# Patient Record
Sex: Male | Born: 1965 | Race: Black or African American | Hispanic: No | Marital: Married | Smoking: Never smoker
Health system: Southern US, Community
[De-identification: ages and names within clinical notes are randomized; demographics above are authoritative.]

## PROBLEM LIST (undated history)

## (undated) DIAGNOSIS — I10 Essential (primary) hypertension: Secondary | ICD-10-CM

---

## 2005-07-06 ENCOUNTER — Emergency Department: Payer: Self-pay | Admitting: Unknown Physician Specialty

## 2019-07-03 ENCOUNTER — Emergency Department: Payer: No Typology Code available for payment source

## 2019-07-03 ENCOUNTER — Encounter: Payer: Self-pay | Admitting: Emergency Medicine

## 2019-07-03 ENCOUNTER — Other Ambulatory Visit: Payer: Self-pay

## 2019-07-03 ENCOUNTER — Emergency Department
Admission: EM | Admit: 2019-07-03 | Discharge: 2019-07-03 | Disposition: A | Discharge status: Home / Self Care | Payer: No Typology Code available for payment source | Attending: Emergency Medicine | Admitting: Emergency Medicine

## 2019-07-03 DIAGNOSIS — Y92410 Unspecified street and highway as the place of occurrence of the external cause: Secondary | ICD-10-CM | POA: Diagnosis not present

## 2019-07-03 DIAGNOSIS — M25521 Pain in right elbow: Secondary | ICD-10-CM | POA: Diagnosis present

## 2019-07-03 DIAGNOSIS — I10 Essential (primary) hypertension: Secondary | ICD-10-CM | POA: Diagnosis not present

## 2019-07-03 DIAGNOSIS — Y999 Unspecified external cause status: Secondary | ICD-10-CM | POA: Diagnosis not present

## 2019-07-03 DIAGNOSIS — M25421 Effusion, right elbow: Secondary | ICD-10-CM | POA: Diagnosis not present

## 2019-07-03 DIAGNOSIS — Y9389 Activity, other specified: Secondary | ICD-10-CM | POA: Diagnosis not present

## 2019-07-03 HISTORY — DX: Essential (primary) hypertension: I10

## 2019-07-03 MED ORDER — MELOXICAM 15 MG PO TABS: 15.0000 mg | ORAL_TABLET | Freq: Every day | ORAL | 2 refills | Status: AC

## 2019-07-03 MED ORDER — METHOCARBAMOL 500 MG PO TABS: 500.0000 mg | ORAL_TABLET | Freq: Four times a day (QID) | ORAL | 0 refills | Status: AC
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
## 2019-07-03 NOTE — Discharge Instructions (Signed)
Follow-up with your regular doctor as needed Follow-up with orthopedics in 1 week if your elbow is not improving. Wear the sling for 2 to 3 days.  Apply ice to the right elbow. Take the meloxicam daily to decrease inflammation and help with pain Robaxin for muscle strain as needed.
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
